# Patient Record
Sex: Female | Born: 1935 | Race: White | Hispanic: No | Marital: Married | Smoking: Never smoker
Health system: Southern US, Community
[De-identification: ages and names within clinical notes are randomized; demographics above are authoritative.]

## PROBLEM LIST (undated history)

## (undated) DIAGNOSIS — G20 Parkinson's disease: Secondary | ICD-10-CM

## (undated) HISTORY — PX: ENTERECTOMY: SHX306

## (undated) HISTORY — PX: EYE SURGERY: SHX253

## (undated) HISTORY — PX: ABDOMINAL HYSTERECTOMY: SHX81

---

## 2009-02-26 ENCOUNTER — Encounter: Admission: RE | Admit: 2009-02-26 | Discharge: 2009-05-27 | Payer: Self-pay | Admitting: Internal Medicine

## 2009-10-13 ENCOUNTER — Encounter: Admission: RE | Admit: 2009-10-13 | Discharge: 2010-01-11 | Payer: Self-pay | Admitting: Neurology

## 2015-09-23 ENCOUNTER — Ambulatory Visit: Payer: Medicare Other | Admitting: Physical Therapy

## 2015-09-26 ENCOUNTER — Encounter: Payer: Medicare Other | Admitting: Physical Therapy

## 2015-09-29 ENCOUNTER — Encounter: Payer: Self-pay | Admitting: Physical Therapy

## 2015-09-29 ENCOUNTER — Ambulatory Visit: Payer: Medicare Other | Attending: Neurology | Admitting: Physical Therapy

## 2015-09-29 DIAGNOSIS — R5381 Other malaise: Secondary | ICD-10-CM | POA: Diagnosis present

## 2015-09-29 DIAGNOSIS — R262 Difficulty in walking, not elsewhere classified: Secondary | ICD-10-CM | POA: Diagnosis present

## 2015-09-29 DIAGNOSIS — G20 Parkinson's disease: Secondary | ICD-10-CM | POA: Diagnosis present

## 2015-09-29 NOTE — Therapy (Signed)
[facility] [place] [address] [place], [place], [address] Phone: [phone]   Fax:  [phone]  Physical Therapy Evaluation  Patient Details  Name: Tiffany York MRN: [id number] Date of Birth: 25-Aug-1931 Referring Provider: Rubin Payor  Encounter Date: 09/29/2015      PT End of Session - 09/29/15 1458    Visit Number 1   Date for PT Re-Evaluation 11/27/15   PT Start Time 1405   PT Stop Time 1501   PT Time Calculation (min) 56 min   Equipment Utilized During Treatment Gait belt   Activity Tolerance Patient limited by fatigue   Behavior During Therapy Flat affect      History reviewed. No pertinent past medical history.  History reviewed. No pertinent past surgical history.  There were no vitals filed for this visit.  Visit Diagnosis:  Debility - Plan: PT plan of care cert/re-cert  Parkinson's disease (HCC) - Plan: PT plan of care cert/re-cert  Difficulty walking - Plan: PT plan of care cert/re-cert      Subjective Assessment - 09/29/15 1402    Subjective Patient has a diagnosis of atypical Parkinson's.  She has been on a decreasing activity pattern for the past few years.  She was exercising on own but over the past few years she has not been able to do this.  She had recent injections of Botox in the eyelids, left arm and left ankle.  She reports some difficulty with use of the left hand, difficulty grasping things.     Patient is accompained by: Family member   Limitations Lifting;Standing;Walking;House hold activities   Patient Stated Goals have better motions, be able to transfer better.   Currently in Pain? No/denies            [facility] PT Assessment - 09/29/15 0001    Assessment   Medical Diagnosis debility iwth atypical parkinson's   Referring Provider Siddiqui   Onset Date/Surgical Date 09/05/15   Hand Dominance Right   Prior Therapy 3 years ago   Balance Screen   Has the patient fallen in the past  6 months No   Has the patient had a decrease in activity level because of a fear of falling?  Yes   Is the patient reluctant to leave their home because of a fear of falling?  Yes   Home Environment   Additional Comments lives with sister.     Prior Function   Level of Independence Independent with transfers   Leisure PLOF:  about 2-3 years ago, [place], was able to transfer with set up only, she was able to walk with a walker x 50 feet with supervision only   Posture/Postural Control   Posture Comments fwd head, rounded shoulders, leans to the left   AROM   Overall AROM Comments the left hand is held with finger flexion with the 4th and 5th fingers held in 90 degrees flexion, I can passively extend to almost neutral but this causes pain to a 4/10, cannot oppose the 4th and 5th digits iwth thumb, AROM of the wrist is WFL's as is the elbow, AROM of the shoulder was limited to 88 degrees flexion, right shoulder flexion to 120 degrees, the left ankle was held in 35 degrees PF, she is unable to actively DF, Passively I can only get her to 20 degrees from neutral , the left ankle is also held into inversion of about 27 degrees, passively I can get her to within 5 degrees  of neutral, knees have ROM that is WFL's, right ankle AROM of DF was to 20 degrees from neutral and PROM of the right nakle was to 10 degrees from neutral, cannot invert the right ankle   Strength   Overall Strength Comments strength of the UE's grossly 4-/5, hips 3+/5, knees 4-/5   Flexibility   Soft Tissue Assessment /Muscle Length --  very tight calves   Palpation   Palpation comment pretty significant edema of the LE's   Transfers   Comments Needs assist with the left foot to assure that it does not roll over ont the lateral ankle. then needs mod assist to stand with gait belt, needed mod assist to stand and tolerance was 20 seconds of standing   Ambulation/Gait   Gait Comments unable to walk, due to the spasticity of the left  ankle into PF and inversion.  She was walking about 50-60 feet about 2 years ago.                   [facility] Adult PT Treatment/Exercise - 09/29/15 0001    Exercises   Exercises Lumbar   Lumbar Exercises: Seated   Other Seated Lumbar Exercises weighted ball over head reach 2x5, weighted ball obliques x 10 and weighted ball throws x 5 each side                  PT Short Term Goals - 09/29/15 1512    PT SHORT TERM GOAL #1   Title independent with her sister and doing sit and get fit exercises at least 1-2 x / week   Time 2   Period Weeks   Status New           PT Long Term Goals - 09/29/15 1512    PT LONG TERM GOAL #1   Title transfer with set up and CGA   Time 8   Period Weeks   Status New   PT LONG TERM GOAL #2   Title increase PROM of the left ankle to 0 degrees   Time 8   Period Weeks   Status New   PT LONG TERM GOAL #3   Title tolerate 5 minutes of standing   Time 8   Period Weeks   Status New   PT LONG TERM GOAL #4   Title walk with walker and min A 50 feet   Time 8   Period Weeks   Status New               Plan - 09/29/15 1504    Clinical Impression Statement Patient with diagnosis of atypical Parkinson's, she hhas really regressed in her abilities over the past 1-2 years, she had been coming to this clinic to work out on her own previously, at that time she was able to transfer with set up, able to walk with supervision using walker.  She currently needs mod A of 2 people to stand, one to hold the left foot in proper position and the other to help her stand.  She was unable to attempt walking   Pt will benefit from skilled therapeutic intervention in order to improve on the following deficits Abnormal gait;Cardiopulmonary status limiting activity;Decreased activity tolerance;Decreased balance;Decreased mobility;Decreased endurance;Decreased range of motion;Decreased strength;Difficulty walking;Increased muscle spasms;Impaired  flexibility;Postural dysfunction;Improper body mechanics   Rehab Potential Fair   PT Frequency 2x / week   PT Duration 8 weeks   PT Treatment/Interventions Electrical Stimulation;Moist Heat;Therapeutic exercise;Therapeutic activities;Functional mobility training;Gait training;Ultrasound;Balance training;Neuromuscular  re-education;Patient/family education;Manual techniques;Passive range of motion   PT Next Visit Plan slowly add exercises for general fitness, PROM for the wrist, fingers and ankle and gait.   Consulted and Agree with Plan of Care Patient          G-Codes - 10/24/15 1527    Functional Assessment Tool Used Foto 68% limitation   Functional Limitation Self care   Self Care Current Status (Z6109) At least 60 percent but less than 80 percent impaired, limited or restricted   Self Care Goal Status (U0454) At least 40 percent but less than 60 percent impaired, limited or restricted       Problem List There are no active problems to display for this patient.   Jearld Lesch., PT 2015/10/24, 3:37 PM  [facility] [place] [facility] [address] [place], [place], [address] Phone: [phone]   Fax:  [phone]  Name: Tiffany York MRN: [id number] Date of Birth: Feb 06, 1931

## 2015-10-02 ENCOUNTER — Encounter: Payer: Self-pay | Admitting: Physical Therapy

## 2015-10-02 ENCOUNTER — Ambulatory Visit: Payer: Medicare Other | Admitting: Physical Therapy

## 2015-10-02 DIAGNOSIS — R5381 Other malaise: Secondary | ICD-10-CM | POA: Diagnosis not present

## 2015-10-02 DIAGNOSIS — R262 Difficulty in walking, not elsewhere classified: Secondary | ICD-10-CM

## 2015-10-02 NOTE — Therapy (Signed)
[facility] [place] [facility] [address] [place], [place], [address] Phone: [phone]   Fax:  [phone]  Physical Therapy Treatment  Patient Details  Name: Tiffany York MRN: [id number] Date of Birth: 10-02-1930 Referring Provider: Rubin Payor  Encounter Date: 10/02/2015      PT End of Session - 10/02/15 1628    Visit Number 2   Date for PT Re-Evaluation 11/27/15   PT Start Time 1530   PT Stop Time 1620   PT Time Calculation (min) 50 min      History reviewed. No pertinent past medical history.  History reviewed. No pertinent past surgical history.  There were no vitals filed for this visit.  Visit Diagnosis:  Debility  Difficulty walking      Subjective Assessment - 10/02/15 1624    Subjective pt verb using a standing hoyer for transfers   Currently in Pain? No/denies                         [facility] Adult PT Treatment/Exercise - 10/02/15 0001    Exercises   Exercises Knee/Hip   Lumbar Exercises: Aerobic   Stationary Bike --   Lumbar Exercises: Seated   Other Seated Lumbar Exercises wt ball reach and oblique   Knee/Hip Exercises: Aerobic   Nustep L 3 5 min   Knee/Hip Exercises: Standing   Other Standing Knee Exercises static standing working on weight shift, righting reactions and picking feet up  mod A to stab and Left LE and assist in balance   Knee/Hip Exercises: Seated   Long Arc Quad Strengthening;Both;2 sets;10 reps  2# plus marching and hip abd                PT Education - 10/02/15 1628    Education provided Yes   Education Details standing transfer frame 10 min 2 times per day,educated sister to make sure Left ankle is straight   Person(s) Educated Patient;Caregiver(s)   Methods Explanation;Demonstration   Comprehension Verbalized understanding          PT Short Term Goals - 09/29/15 1512    PT SHORT TERM GOAL #1   Title independent with her sister and doing sit and get  fit exercises at least 1-2 x / week   Time 2   Period Weeks   Status New           PT Long Term Goals - 09/29/15 1512    PT LONG TERM GOAL #1   Title transfer with set up and CGA   Time 8   Period Weeks   Status New   PT LONG TERM GOAL #2   Title increase PROM of the left ankle to 0 degrees   Time 8   Period Weeks   Status New   PT LONG TERM GOAL #3   Title tolerate 5 minutes of standing   Time 8   Period Weeks   Status New   PT LONG TERM GOAL #4   Title walk with walker and min A 50 feet   Time 8   Period Weeks   Status New               Plan - 10/02/15 1629    Clinical Impression Statement pt with increased tolerance to standing, able to stand 4 times 1-2 mins each time working on weight shift and picking up feet. Assistance required to stab Left ankle and aid in balance and  fwd weight shift. Tolerated ther ex well. Very tight LE.   PT Next Visit Plan assess standing tolerance at home and progress here        Problem List There are no active problems to display for this patient.   Juancarlos Crescenzo,ANGIE PTA 10/02/2015, 4:33 PM  [facility] [place] [facility] [address] [place], [place], [address] Phone: [phone]   Fax:  [phone]  Name: Tiffany York MRN: [id number] Date of Birth: Feb 09, 1931

## 2015-10-07 ENCOUNTER — Encounter: Payer: Self-pay | Admitting: Physical Therapy

## 2015-10-07 ENCOUNTER — Ambulatory Visit: Payer: Medicare Other | Admitting: Physical Therapy

## 2015-10-07 DIAGNOSIS — R5381 Other malaise: Secondary | ICD-10-CM

## 2015-10-07 DIAGNOSIS — R262 Difficulty in walking, not elsewhere classified: Secondary | ICD-10-CM

## 2015-10-07 DIAGNOSIS — G20 Parkinson's disease: Secondary | ICD-10-CM

## 2015-10-07 NOTE — Therapy (Signed)
[facility] [place] [facility] [address] [place], [place], [address] Phone: [phone]   Fax:  [phone]  Physical Therapy Treatment  Patient Details  Name: Tiffany York MRN: [id number] Date of Birth: 01-Feb-1931 Referring Provider: Rubin Payor  Encounter Date: 10/07/2015      PT End of Session - 10/07/15 1443    Visit Number 3   Date for PT Re-Evaluation 11/27/15   PT Start Time 1356   PT Stop Time 1445   PT Time Calculation (min) 49 min      History reviewed. No pertinent past medical history.  History reviewed. No pertinent past surgical history.  There were no vitals filed for this visit.  Visit Diagnosis:  Debility  Difficulty walking  Parkinson's disease (HCC)      Subjective Assessment - 10/07/15 1402    Subjective could not use transfer aid to stadn with as it was too painful and pinched in armpits   Currently in Pain? No/denies                         [facility] Adult PT Treatment/Exercise - 10/07/15 0001    Ambulation/Gait   Gait Comments amb HHA off 2 with fascilitation of wt shift and Left anklle stab 18 feet   Lumbar Exercises: Seated   Other Seated Lumbar Exercises wt ball reach and oblique   Knee/Hip Exercises: Aerobic   Nustep L 3 6 min   Knee/Hip Exercises: Machines for Strengthening   Other Machine seated red tband LAQ,HS curl ,hip abd,marching  yellow tabdn scap stab 10 reps each   Knee/Hip Exercises: Standing   Hip ADduction Strengthening;Right;1 set;10 reps  mod A to fasciltate weight thru Left LE. marching   Other Standing Knee Exercises static standing with RW 2 min 2 times   Other Standing Knee Exercises dynamic stadning with func reaching and LE mvmt                  PT Short Term Goals - 09/29/15 1512    PT SHORT TERM GOAL #1   Title independent with her sister and doing sit and get fit exercises at least 1-2 x / week   Time 2   Period Weeks   Status New            PT Long Term Goals - 09/29/15 1512    PT LONG TERM GOAL #1   Title transfer with set up and CGA   Time 8   Period Weeks   Status New   PT LONG TERM GOAL #2   Title increase PROM of the left ankle to 0 degrees   Time 8   Period Weeks   Status New   PT LONG TERM GOAL #3   Title tolerate 5 minutes of standing   Time 8   Period Weeks   Status New   PT LONG TERM GOAL #4   Title walk with walker and min A 50 feet   Time 8   Period Weeks   Status New               Plan - 10/07/15 1443    Clinical Impression Statement pt with increased righting reaction, increased static standing and initiation of gait   PT Next Visit Plan progress activity standing and gait. LE strength        Problem List There are no active problems to display for this patient.   PAYSEUR,ANGIE PTA  10/07/2015, 2:46 PM  [facility] [place] [facility] [address] [place], [place], [address] Phone: [phone]   Fax:  [phone]  Name: Tiffany York MRN: [id number] Date of Birth: 1930/10/16

## 2015-10-09 ENCOUNTER — Ambulatory Visit: Payer: Medicare Other | Admitting: Physical Therapy

## 2015-10-09 ENCOUNTER — Encounter: Payer: Self-pay | Admitting: Physical Therapy

## 2015-10-09 DIAGNOSIS — R5381 Other malaise: Secondary | ICD-10-CM | POA: Diagnosis not present

## 2015-10-09 DIAGNOSIS — R262 Difficulty in walking, not elsewhere classified: Secondary | ICD-10-CM

## 2015-10-09 DIAGNOSIS — G20 Parkinson's disease: Secondary | ICD-10-CM

## 2015-10-09 NOTE — Therapy (Signed)
[facility] [place] [facility] [address] [place], [place], [address] Phone: [phone]   Fax:  [phone]  Physical Therapy Treatment  Patient Details  Name: Tiffany York MRN: [id number] Date of Birth: 1931-01-25 Referring Provider: Rubin Payor  Encounter Date: 10/09/2015      PT End of Session - 10/09/15 1533    Visit Number 4   Date for PT Re-Evaluation 11/27/15   PT Start Time 1446   PT Stop Time 1530   PT Time Calculation (min) 44 min   Equipment Utilized During Treatment Gait belt   Activity Tolerance Patient limited by fatigue   Behavior During Therapy Flat affect      History reviewed. No pertinent past medical history.  History reviewed. No pertinent past surgical history.  There were no vitals filed for this visit.  Visit Diagnosis:  Debility  Difficulty walking  Parkinson's disease [facility])      Subjective Assessment - 10/09/15 1454    Subjective Felt okay after last visit, not sore   Currently in Pain? No/denies                         [facility] Adult PT Treatment/Exercise - 10/09/15 0001    Ambulation/Gait   Gait Comments ambulation with FWW (her own) with one person assist to block the left lateral ankle roll, some assist to advance walker, verbal cues to weight shift and take steps, and a lot of assist due to backward lean   Lumbar Exercises: Seated   Other Seated Lumbar Exercises wt ball reach and oblique   Knee/Hip Exercises: Aerobic   Nustep L 4 6 min   Knee/Hip Exercises: Standing   Other Standing Knee Exercises static standing with RW 2 min 2 times   Other Standing Knee Exercises dynamic stadning with func reaching and LE mvmt   Knee/Hip Exercises: Seated   Long Arc Quad Strengthening;Both;2 sets;10 reps   Marching Limitations 2# 2x10 bilaterally   Abduction/Adduction  20 reps   Abd/Adduction Limitations isometrics                  PT Short Term Goals - 10/09/15 1535    PT SHORT TERM GOAL #1   Title independent with her sister and doing sit and get fit exercises at least 1-2 x / week   Status On-going           PT Long Term Goals - 09/29/15 1512    PT LONG TERM GOAL #1   Title transfer with set up and CGA   Time 8   Period Weeks   Status New   PT LONG TERM GOAL #2   Title increase PROM of the left ankle to 0 degrees   Time 8   Period Weeks   Status New   PT LONG TERM GOAL #3   Title tolerate 5 minutes of standing   Time 8   Period Weeks   Status New   PT LONG TERM GOAL #4   Title walk with walker and min A 50 feet   Time 8   Period Weeks   Status New               Plan - 10/09/15 1534    Clinical Impression Statement Patient does suprisingly well for her condition, she was able to walk with me today 2x12 feet, with a lot of assist and cues but she has not walked at all in about  9 months.  The left ankel needs gauding with any standing due to lateral roll   PT Next Visit Plan progress activity standing and gait. LE strength   Consulted and Agree with Plan of Care Patient        Problem List There are no active problems to display for this patient.   Jearld Lesch., PT 10/09/2015, 3:37 PM  [facility] [place] [facility] [address] [place], [place], [address] Phone: [phone]   Fax:  [phone]  Name: Tiffany York MRN: [id number] Date of Birth: 31-Dec-1930

## 2015-10-14 ENCOUNTER — Ambulatory Visit: Payer: Medicare Other | Admitting: Physical Therapy

## 2015-10-14 ENCOUNTER — Encounter: Payer: Self-pay | Admitting: Physical Therapy

## 2015-10-14 DIAGNOSIS — R5381 Other malaise: Secondary | ICD-10-CM | POA: Diagnosis not present

## 2015-10-14 DIAGNOSIS — R262 Difficulty in walking, not elsewhere classified: Secondary | ICD-10-CM

## 2015-10-14 NOTE — Therapy (Signed)
[facility] [place] [facility] [address] [place], [place], [address] Phone: [phone]   Fax:  [phone]  Physical Therapy Treatment  Patient Details  Name: Tiffany York MRN: [id number] Date of Birth: [birth year] Referring Provider: Rubin Payor  Encounter Date: 10/14/2015      PT End of Session - 10/14/15 1444    Visit Number 5   Date for PT Re-Evaluation 11/27/15   PT Start Time 1400   PT Stop Time 1450   PT Time Calculation (min) 50 min      History reviewed. No pertinent past medical history.  History reviewed. No pertinent past surgical history.  There were no vitals filed for this visit.  Visit Diagnosis:  Difficulty walking  Debility      Subjective Assessment - 10/14/15 1409    Subjective doing okay                         [facility] Adult PT Treatment/Exercise - 10/14/15 0001    Ambulation/Gait   Gait Comments ambulation with FWW (her own), 20 feet,20 feet 40 feet, with one person assist to block the left lateral ankle roll, some assist to advance walker, verbal cues to weight shift and take steps, and a lot of assist due to backward lean  left foot IR,and left foot crosses midline with stepping   High Level Balance   High Level Balance Activities --  static standing,dynamic standing,func reaching,ball toss   Knee/Hip Exercises: Aerobic   Nustep L 4 6 min   Knee/Hip Exercises: Machines for Strengthening   Cybex Knee Extension 10# 2 sets 10   Cybex Knee Flexion 15# 2 sets 10                PT Education - 10/14/15 1444    Education provided Yes   Education Details LAQ and marching 10 reps each leg 3-5 times per day   Person(s) Educated Patient   Methods Explanation;Demonstration   Comprehension Verbalized understanding;Returned demonstration          PT Short Term Goals - 10/14/15 1446    PT SHORT TERM GOAL #1   Title independent with her sister and doing sit and get fit exercises  at least 1-2 x / week   Status On-going           PT Long Term Goals - 10/14/15 1446    PT LONG TERM GOAL #1   Title transfer with set up and CGA   Status On-going   PT LONG TERM GOAL #2   Title increase PROM of the left ankle to 0 degrees   Status On-going   PT LONG TERM GOAL #3   Title tolerate 5 minutes of standing   Status On-going   PT LONG TERM GOAL #4   Title walk with walker and min A 50 feet   Status On-going               Plan - 10/14/15 1445    Clinical Impression Statement pt with increased ability to amb today,as fatigued Left ankle rolls more and needs increase support. posterior lean with some righting reaction,occassional help to advance walker. Progressing with weight bearinga nd stadning activities.   PT Next Visit Plan progress activity standing and gait. LE strength        Problem List There are no active problems to display for this patient.   Marcha Licklider,ANGIE PTA 10/14/2015, 2:49 PM  [facility] [place] [address] [place], [place], [address] Phone: [phone]   Fax:  [phone]  Name: Tiffany York MRN: [id number] Date of Birth: Dec 11, 1930

## 2015-10-16 ENCOUNTER — Ambulatory Visit: Payer: Medicare Other | Attending: Neurology | Admitting: Physical Therapy

## 2015-10-16 ENCOUNTER — Encounter: Payer: Self-pay | Admitting: Physical Therapy

## 2015-10-16 DIAGNOSIS — R5381 Other malaise: Secondary | ICD-10-CM | POA: Insufficient documentation

## 2015-10-16 DIAGNOSIS — G20 Parkinson's disease: Secondary | ICD-10-CM | POA: Insufficient documentation

## 2015-10-16 DIAGNOSIS — R262 Difficulty in walking, not elsewhere classified: Secondary | ICD-10-CM | POA: Insufficient documentation

## 2015-10-16 NOTE — Therapy (Signed)
[facility] [place] [facility] [address] [place], [place], [address] Phone: [phone]   Fax:  [phone]  Physical Therapy Treatment  Patient Details  Name: Tiffany York MRN: [id number] Date of Birth: 04-24-1931 Referring Provider: Rubin Payor  Encounter Date: 10/16/2015      PT End of Session - 10/16/15 1446    Visit Number 6   Date for PT Re-Evaluation 11/27/15   PT Start Time 1401   PT Stop Time 1446   PT Time Calculation (min) 45 min   Equipment Utilized During Treatment Gait belt   Activity Tolerance Patient limited by fatigue   Behavior During Therapy Flat affect      History reviewed. No pertinent past medical history.  History reviewed. No pertinent past surgical history.  There were no vitals filed for this visit.  Visit Diagnosis:  Difficulty walking  Debility  Parkinson's disease (HCC)      Subjective Assessment - 10/16/15 1404    Subjective I was tired and sore after the last visit.   Currently in Pain? No/denies                         [facility] Adult PT Treatment/Exercise - 10/16/15 0001    Ambulation/Gait   Gait Comments ambulation with FWW (her own), 20 feet,20 feet 40 feet, with one person assist to block the left lateral ankle roll, some assist to advance walker, verbal cues to weight shift and take steps, and a lot of assist due to backward lean   High Level Balance   High Level Balance Comments standing balance activities, weight shift, reaching   Lumbar Exercises: Aerobic   Elliptical NuStep Level 5x6 minutes   UBE (Upper Arm Bike) Level 1 x 4 minutes   Lumbar Exercises: Machines for Strengthening   Other Lumbar Machine Exercise 5# on pulley system row and chest press type activities, 15# straight arm pull downs all 2x10   Lumbar Exercises: Seated   Other Seated Lumbar Exercises wt ball reach and oblique, seated [facility] parkinson's moves 15 reps each   Knee/Hip Exercises: Standing   Other Standing Knee Exercises static standing with RW 2 min 2 times   Knee/Hip Exercises: Seated   Long Arc Quad 2 sets;10 reps   Long Arc Quad Weight 3 lbs.   Marching Limitations 2# 2x10 bilaterally                  PT Short Term Goals - 10/16/15 1451    PT SHORT TERM GOAL #1   Title independent with her sister and doing sit and get fit exercises at least 1-2 x / week   Status Achieved           PT Long Term Goals - 10/14/15 1446    PT LONG TERM GOAL #1   Title transfer with set up and CGA   Status On-going   PT LONG TERM GOAL #2   Title increase PROM of the left ankle to 0 degrees   Status On-going   PT LONG TERM GOAL #3   Title tolerate 5 minutes of standing   Status On-going   PT LONG TERM GOAL #4   Title walk with walker and min A 50 feet   Status On-going               Plan - 10/16/15 1449    Clinical Impression Statement Patient remains with flat affect due to the Parkinson's, she  puts forth good effort with exercises but with balance she is very fearful and tends to lean toward the back, requiring encouragement to lean forward.   PT Next Visit Plan progress activity standing and gait. LE strength   Consulted and Agree with Plan of Care Patient        Problem List There are no active problems to display for this patient.   Jearld Lesch., PT 10/16/2015, 2:52 PM  [facility] [place] [facility] [address] [place], [place], [address] Phone: [phone]   Fax:  [phone]  Name: Tiffany York MRN: [id number] Date of Birth: [birth year]

## 2015-10-21 ENCOUNTER — Encounter: Payer: Self-pay | Admitting: Physical Therapy

## 2015-10-21 ENCOUNTER — Ambulatory Visit: Payer: Medicare Other | Admitting: Physical Therapy

## 2015-10-21 DIAGNOSIS — R5381 Other malaise: Secondary | ICD-10-CM

## 2015-10-21 DIAGNOSIS — G20 Parkinson's disease: Secondary | ICD-10-CM

## 2015-10-21 DIAGNOSIS — R262 Difficulty in walking, not elsewhere classified: Secondary | ICD-10-CM | POA: Diagnosis not present

## 2015-10-21 NOTE — Therapy (Signed)
[facility] [place] [address] [place], [place], [address] Phone: [phone]   Fax:  [phone]  Physical Therapy Treatment  Patient Details  Name: Tiffany York MRN: [id number] Date of Birth: [birth year] Referring Provider: Rubin Payor  Encounter Date: 10/21/2015      PT End of Session - 10/21/15 1441    Visit Number 7   Date for PT Re-Evaluation 11/27/15   PT Start Time 1345   PT Stop Time 1430   PT Time Calculation (min) 45 min   Activity Tolerance Patient limited by fatigue   Behavior During Therapy Flat affect      History reviewed. No pertinent past medical history.  History reviewed. No pertinent past surgical history.  There were no vitals filed for this visit.  Visit Diagnosis:  Difficulty walking  Debility  Parkinson's disease (HCC)      Subjective Assessment - 10/21/15 1357    Subjective "Im ok"   Currently in Pain? No/denies   Multiple Pain Sites No                         [facility] Adult PT Treatment/Exercise - 10/21/15 0001    Ambulation/Gait   Gait Comments ambulation with FWW (her own), 20 feet, with two person assist to block the left lateral ankle roll, some assist to advance walker, verbal cues to weight shift and take steps, and a lot of assist due to backward lean   Lumbar Exercises: Aerobic   Elliptical NuStep Level 3 x6 minutes   UBE (Upper Arm Bike) Level 1 x 4 minutes   Lumbar Exercises: Machines for Strengthening   Other Lumbar Machine Exercise 5# on pulley system row and chest press type activities, 15# straight arm pull downs all 2x10   Lumbar Exercises: Seated   Other Seated Lumbar Exercises Seated forward reach alternating UE and bilat UE,  OHP red ball 2x10, Rows with Red Tband 2x10,    Knee/Hip Exercises: Seated   Long Arc Quad 2 sets;10 reps                  PT Short Term Goals - 10/16/15 1451    PT SHORT TERM GOAL #1   Title independent with her  sister and doing sit and get fit exercises at least 1-2 x / week   Status Achieved           PT Long Term Goals - 10/14/15 1446    PT LONG TERM GOAL #1   Title transfer with set up and CGA   Status On-going   PT LONG TERM GOAL #2   Title increase PROM of the left ankle to 0 degrees   Status On-going   PT LONG TERM GOAL #3   Title tolerate 5 minutes of standing   Status On-going   PT LONG TERM GOAL #4   Title walk with walker and min A 50 feet   Status On-going               Plan - 10/21/15 1442    Clinical Impression Statement Pt c/o of RLE pain with ambulation, flat affect due to parkinson's'. Fearful with ambulation requires cues to correct backward lean.    Pt will benefit from skilled therapeutic intervention in order to improve on the following deficits Abnormal gait;Cardiopulmonary status limiting activity;Decreased activity tolerance;Decreased balance;Decreased mobility;Decreased endurance;Decreased range of motion;Decreased strength;Difficulty walking;Increased muscle spasms;Impaired flexibility;Postural dysfunction;Improper body mechanics   Rehab Potential  Fair   PT Frequency 2x / week   PT Duration 8 weeks   PT Treatment/Interventions Electrical Stimulation;Moist Heat;Therapeutic exercise;Therapeutic activities;Functional mobility training;Gait training;Ultrasound;Balance training;Neuromuscular re-education;Patient/family education;Manual techniques;Passive range of motion   PT Next Visit Plan progress activity standing and gait. LE strength        Problem List There are no active problems to display for this patient.   Grayce Sessions, PTA  10/21/2015, 2:45 PM  [facility] [place] [address] [place], [place], [address] Phone: [phone]   Fax:  [phone]  Name: Tiffany York MRN: [id number] Date of Birth: 1931-08-09

## 2015-10-28 ENCOUNTER — Encounter: Payer: Self-pay | Admitting: Physical Therapy

## 2015-10-28 ENCOUNTER — Ambulatory Visit: Payer: Medicare Other | Admitting: Physical Therapy

## 2015-10-28 DIAGNOSIS — R262 Difficulty in walking, not elsewhere classified: Secondary | ICD-10-CM | POA: Diagnosis not present

## 2015-10-28 DIAGNOSIS — R5381 Other malaise: Secondary | ICD-10-CM

## 2015-10-28 DIAGNOSIS — G20 Parkinson's disease: Secondary | ICD-10-CM

## 2015-10-28 NOTE — Therapy (Signed)
[facility] [place] [address] [place], [place], [address] Phone: [phone]   Fax:  [phone]  Physical Therapy Treatment  Patient Details  Name: Tiffany York MRN: [id number] Date of Birth: 17-Apr-1931 Referring Provider: Rubin Payor  Encounter Date: 10/28/2015      PT End of Session - 10/28/15 1428    Visit Number 8   Date for PT Re-Evaluation 11/27/15   PT Start Time 1346   PT Stop Time 1428   PT Time Calculation (min) 42 min   Activity Tolerance Patient limited by fatigue   Behavior During Therapy Flat affect      History reviewed. No pertinent past medical history.  History reviewed. No pertinent past surgical history.  There were no vitals filed for this visit.  Visit Diagnosis:  Difficulty walking  Debility  Parkinson's disease [facility])      Subjective Assessment - 10/28/15 1352    Subjective "Im doing fine"   Currently in Pain? No/denies   Multiple Pain Sites No                         [facility] Adult PT Treatment/Exercise - 10/28/15 0001    Ambulation/Gait   Gait Comments ambulation with FWW (her own), 22 feet, with one person assist to block the left lateral ankle roll, some assist to advance walker, verbal cues to weight shift and take steps, and a lot of assist due to backward lean   Lumbar Exercises: Aerobic   Elliptical NuStep Level 3 x6 minutes   UBE (Upper Arm Bike) Level 1 x 4 minutes   Lumbar Exercises: Machines for Strengthening   Other Lumbar Machine Exercise 5# on pulley system row and chest press type activities, straight arm pull downs all 2x15   Knee/Hip Exercises: Seated   Long Arc Quad 2 sets;10 reps   Long Arc Quad Weight 2 lbs.   Marching Limitations 2# 2x10 bilaterally                  PT Short Term Goals - 10/16/15 1451    PT SHORT TERM GOAL #1   Title independent with her sister and doing sit and get fit exercises at least 1-2 x / week   Status  Achieved           PT Long Term Goals - 10/14/15 1446    PT LONG TERM GOAL #1   Title transfer with set up and CGA   Status On-going   PT LONG TERM GOAL #2   Title increase PROM of the left ankle to 0 degrees   Status On-going   PT LONG TERM GOAL #3   Title tolerate 5 minutes of standing   Status On-going   PT LONG TERM GOAL #4   Title walk with walker and min A 50 feet   Status On-going               Plan - 10/28/15 1428    Clinical Impression Statement Continues to require cues with amb not to lean backwards. Pt requires verbal and tactile cues to perform ambulation. Flat affect remains due to parkinson's. No c/o pain during today's treatment.   Pt will benefit from skilled therapeutic intervention in order to improve on the following deficits Abnormal gait;Cardiopulmonary status limiting activity;Decreased activity tolerance;Decreased balance;Decreased mobility;Decreased endurance;Decreased range of motion;Decreased strength;Difficulty walking;Increased muscle spasms;Impaired flexibility;Postural dysfunction;Improper body mechanics   Rehab Potential Fair   PT Frequency 2x /  week   PT Duration 8 weeks   PT Treatment/Interventions Electrical Stimulation;Moist Heat;Therapeutic exercise;Therapeutic activities;Functional mobility training;Gait training;Ultrasound;Balance training;Neuromuscular re-education;Patient/family education;Manual techniques;Passive range of motion   PT Next Visit Plan progress activity standing and gait. LE strength        Problem List There are no active problems to display for this patient.   Grayce Sessions, PTA  10/28/2015, 2:31 PM  [facility] [place] [address] [place], [place], [address] Phone: [phone]   Fax:  [phone]  Name: Tiffany York MRN: [id number] Date of Birth: June 19, 1931

## 2015-10-30 ENCOUNTER — Ambulatory Visit: Payer: Medicare Other | Admitting: Physical Therapy

## 2015-10-30 ENCOUNTER — Encounter: Payer: Self-pay | Admitting: Physical Therapy

## 2015-10-30 DIAGNOSIS — R5381 Other malaise: Secondary | ICD-10-CM

## 2015-10-30 DIAGNOSIS — R262 Difficulty in walking, not elsewhere classified: Secondary | ICD-10-CM | POA: Diagnosis not present

## 2015-10-30 NOTE — Therapy (Signed)
[facility] [place] [address] [place], [place], [address] Phone: [phone]   Fax:  [phone]  Physical Therapy Treatment  Patient Details  Name: Tiffany York MRN: [id number] Date of Birth: [birth year] Referring Provider: Rubin Payor  Encounter Date: 10/30/2015      PT End of Session - 10/30/15 1524    Visit Number 9   PT Start Time 1432   PT Stop Time 1522   PT Time Calculation (min) 50 min   Activity Tolerance Patient limited by fatigue;Patient limited by pain   Behavior During Therapy Flat affect      History reviewed. No pertinent past medical history.  History reviewed. No pertinent past surgical history.  There were no vitals filed for this visit.  Visit Diagnosis:  Difficulty walking  Debility      Subjective Assessment - 10/30/15 1435    Subjective Pt repots she is feeling all right   Currently in Pain? No/denies   Multiple Pain Sites No                         [facility] Adult PT Treatment/Exercise - 10/30/15 0001    Lumbar Exercises: Aerobic   Elliptical NuStep Level 3 x6 minutes   UBE (Upper Arm Bike) Level 1 x 4 minutes   Lumbar Exercises: Seated   Other Seated Lumbar Exercises Seated forward reach alternating UE and bilat UE,  OHP red ball 2x10, Rows with Red Tband 2x10; seated ball toss     Knee/Hip Exercises: Standing   Other Standing Knee Exercises Static standing (UE on therapist shoulder), march 2x10    Knee/Hip Exercises: Seated   Long Arc Quad 2 sets;10 reps   Long Arc Quad Weight 3 lbs.   Ball Squeeze 2x10   Marching Limitations 3# 2x10 bilaterally                  PT Short Term Goals - 10/16/15 1451    PT SHORT TERM GOAL #1   Title independent with her sister and doing sit and get fit exercises at least 1-2 x / week   Status Achieved           PT Long Term Goals - 10/14/15 1446    PT LONG TERM GOAL #1   Title transfer with set up and CGA   Status  On-going   PT LONG TERM GOAL #2   Title increase PROM of the left ankle to 0 degrees   Status On-going   PT LONG TERM GOAL #3   Title tolerate 5 minutes of standing   Status On-going   PT LONG TERM GOAL #4   Title walk with walker and min A 50 feet   Status On-going               Plan - 10/30/15 1525    Clinical Impression Statement No ambulation today due to Pt L ankle pain when walking. Pt able to tolerate standing with weight shifts with minor c/o pain with L ankle. tolerated all other interventions well.   Pt will benefit from skilled therapeutic intervention in order to improve on the following deficits Abnormal gait;Cardiopulmonary status limiting activity;Decreased activity tolerance;Decreased balance;Decreased mobility;Decreased endurance;Decreased range of motion;Decreased strength;Difficulty walking;Increased muscle spasms;Impaired flexibility;Postural dysfunction;Improper body mechanics   Rehab Potential Fair   PT Frequency 2x / week   PT Duration 8 weeks   PT Treatment/Interventions Electrical Stimulation;Moist Heat;Therapeutic exercise;Therapeutic activities;Functional mobility training;Gait training;Ultrasound;Balance training;Neuromuscular  re-education;Patient/family education;Manual techniques;Passive range of motion   PT Next Visit Plan progress activity standing and gait. LE strength        Problem List There are no active problems to display for this patient.   Grayce Sessions, PTA  10/30/2015, 3:29 PM  [facility] [place] [address] [place], [place], [address] Phone: [phone]   Fax:  [phone]  Name: Tiffany York MRN: [id number] Date of Birth: 09-06-1931

## 2015-11-03 ENCOUNTER — Ambulatory Visit: Payer: Medicare Other | Admitting: Physical Therapy

## 2015-11-05 ENCOUNTER — Encounter: Payer: Self-pay | Admitting: Physical Therapy

## 2015-11-05 ENCOUNTER — Ambulatory Visit: Payer: Medicare Other | Admitting: Physical Therapy

## 2015-11-05 DIAGNOSIS — R5381 Other malaise: Secondary | ICD-10-CM

## 2015-11-05 DIAGNOSIS — R262 Difficulty in walking, not elsewhere classified: Secondary | ICD-10-CM | POA: Diagnosis not present

## 2015-11-05 DIAGNOSIS — G20 Parkinson's disease: Secondary | ICD-10-CM

## 2015-11-05 NOTE — Therapy (Signed)
[facility] [place] [address] [place], [place], [address] Phone: [phone]   Fax:  [phone]  Physical Therapy Treatment  Patient Details  Name: Tiffany York MRN: [id number] Date of Birth: [birth year] Referring Provider: Rubin Payor  Encounter Date: 11/05/2015      PT End of Session - 11/05/15 1150    Visit Number 10   PT Start Time 1100   PT Stop Time 1141   PT Time Calculation (min) 41 min      History reviewed. No pertinent past medical history.  History reviewed. No pertinent past surgical history.  There were no vitals filed for this visit.  Visit Diagnosis:  Difficulty walking  Debility  Parkinson's disease [facility])      Subjective Assessment - 11/05/15 1105    Subjective "Im doing all right"   Patient is accompained by: Family member   Currently in Pain? No/denies   Multiple Pain Sites No                         [facility] Adult PT Treatment/Exercise - 11/05/15 0001    Lumbar Exercises: Aerobic   Elliptical NuStep Level 3 x6 minutes   UBE (Upper Arm Bike) Level 1 x 4 minutes   Lumbar Exercises: Machines for Strengthening   Other Lumbar Machine Exercise 5# on pulley system, hi to low rows, rows, and chest press type activities, straight arm pull downs all 2x15   Lumbar Exercises: Seated   Other Seated Lumbar Exercises OHP with red ball 2x10   Knee/Hip Exercises: Seated   Long Arc Quad 2 sets;15 reps   Long Arc Quad Weight 3 lbs.   Ball Squeeze 2x15   Marching Limitations 3# 2x10 bilaterally                  PT Short Term Goals - 10/16/15 1451    PT SHORT TERM GOAL #1   Title independent with her sister and doing sit and get fit exercises at least 1-2 x / week   Status Achieved           PT Long Term Goals - 10/14/15 1446    PT LONG TERM GOAL #1   Title transfer with set up and CGA   Status On-going   PT LONG TERM GOAL #2   Title increase PROM of the left ankle to  0 degrees   Status On-going   PT LONG TERM GOAL #3   Title tolerate 5 minutes of standing   Status On-going   PT LONG TERM GOAL #4   Title walk with walker and min A 50 feet   Status On-going               Plan - 11/05/15 1150    Clinical Impression Statement Again no ambulation due to pt c/o L ankle pain.Pt performed all exercises well.  Pt sister and caregiver present for today's treatment in preporation for pt to transfer to independent program. Pt's care give instructed on all interventions . Caregiver instructed on proper machine set up, and how pt is suppose to properly execute each exercise. Caregiver verbalized understanding.    Pt will benefit from skilled therapeutic intervention in order to improve on the following deficits Abnormal gait;Cardiopulmonary status limiting activity;Decreased activity tolerance;Decreased balance;Decreased mobility;Decreased endurance;Decreased range of motion;Decreased strength;Difficulty walking;Increased muscle spasms;Impaired flexibility;Postural dysfunction;Improper body mechanics   Rehab Potential Fair   PT Frequency 2x / week   PT  Duration 8 weeks   PT Treatment/Interventions Electrical Stimulation;Moist Heat;Therapeutic exercise;Therapeutic activities;Functional mobility training;Gait training;Ultrasound;Balance training;Neuromuscular re-education;Patient/family education;Manual techniques;Passive range of motion   PT Next Visit Plan progress activity standing and gait. LE strength, prepared for indp program.        Problem List There are no active problems to display for this patient.   Grayce Sessions, PTA  11/05/2015, 11:55 AM  [facility] [place] [address] [place], [place], [address] Phone: [phone]   Fax:  [phone]  Name: Tiffany York MRN: [id number] Date of Birth: [birth year]

## 2015-11-07 ENCOUNTER — Encounter: Payer: Self-pay | Admitting: Physical Therapy

## 2015-11-07 ENCOUNTER — Ambulatory Visit: Payer: Medicare Other | Admitting: Physical Therapy

## 2015-11-07 DIAGNOSIS — R262 Difficulty in walking, not elsewhere classified: Secondary | ICD-10-CM

## 2015-11-07 DIAGNOSIS — R5381 Other malaise: Secondary | ICD-10-CM

## 2015-11-07 NOTE — Therapy (Signed)
[facility] [place] [address] [place], [place], [address] Phone: [phone]   Fax:  [phone]  Physical Therapy Treatment  Patient Details  Name: Tiffany York MRN: [id number] Date of Birth: Aug 06, 1931 Referring Provider: Rubin Payor  Encounter Date: 11/07/2015      PT End of Session - 11/07/15 1101    Visit Number 11   Date for PT Re-Evaluation 11/27/15   PT Start Time 1015   PT Stop Time 1100   PT Time Calculation (min) 45 min      History reviewed. No pertinent past medical history.  History reviewed. No pertinent past surgical history.  There were no vitals filed for this visit.  Visit Diagnosis:  Difficulty walking  Debility      Subjective Assessment - 11/07/15 1025    Subjective doing the same, brought transport chair to see if works better on equipment   Patient is accompained by: Family member   Currently in Pain? No/denies                         [facility] Adult PT Treatment/Exercise - 11/07/15 0001    Lumbar Exercises: Aerobic   Elliptical nustep L 4 7 min   UBE (Upper Arm Bike) Level 1 x 6 minutes   Lumbar Exercises: Seated   Other Seated Lumbar Exercises 2# shld flex,abd,chest press and bicep curl 2 sets 10  5# pulleys   Knee/Hip Exercises: Seated   Other Seated Knee/Hip Exercises 3# LAQ,marching and hip abd 2 sets 10   Sit to Sand with UE support;2 sets  30 sec each at sink with caregiver                  PT Short Term Goals - 10/16/15 1451    PT SHORT TERM GOAL #1   Title independent with her sister and doing sit and get fit exercises at least 1-2 x / week   Status Achieved           PT Long Term Goals - 11/07/15 1103    PT LONG TERM GOAL #1   Title transfer with set up and CGA   PT LONG TERM GOAL #2   Title increase PROM of the left ankle to 0 degrees   Status On-going   PT LONG TERM GOAL #3   Title tolerate 5 minutes of standing   Status On-going   PT LONG TERM GOAL #4   Title walk with walker and min A 50 feet   Status On-going               Plan - 11/07/15 1101    Clinical Impression Statement education with CG to transition to idependant ex program   PT Next Visit Plan 1 visit next week then HOLD 2 weeks to assure transition to independant ex program        Problem List There are no active problems to display for this patient.   Katiria Calame,ANGIE PTA 11/07/2015, 11:04 AM  [facility] [place] [address] [facility] [address] [facility] [address] [place], [place], [address] Phone: [phone]   Fax:  [phone]  Name: Ezella Kell MRN: [id number] Date of Birth: [birth year]

## 2015-11-11 ENCOUNTER — Ambulatory Visit: Payer: Medicare Other | Admitting: Physical Therapy

## 2015-11-11 ENCOUNTER — Encounter: Payer: Self-pay | Admitting: Physical Therapy

## 2015-11-11 DIAGNOSIS — R262 Difficulty in walking, not elsewhere classified: Secondary | ICD-10-CM | POA: Diagnosis not present

## 2015-11-11 DIAGNOSIS — G20 Parkinson's disease: Secondary | ICD-10-CM

## 2015-11-11 DIAGNOSIS — R5381 Other malaise: Secondary | ICD-10-CM

## 2015-11-11 NOTE — Therapy (Signed)
[facility] [place] [address] [place], [place], [address] Phone: [phone]   Fax:  [phone]  Physical Therapy Treatment  Patient Details  Name: Tiffany York MRN: [id number] Date of Birth: [birth year] Referring Provider: Linus Mako  Encounter Date: 11/11/2015      PT End of Session - 11/11/15 1430    Visit Number 12   Date for PT Re-Evaluation 11/27/15   PT Start Time 3893   PT Stop Time 1430   PT Time Calculation (min) 42 min   Activity Tolerance Patient limited by fatigue;Patient limited by pain   Behavior During Therapy Flat affect      History reviewed. No pertinent past medical history.  History reviewed. No pertinent past surgical history.  There were no vitals filed for this visit.  Visit Diagnosis:  Difficulty walking  Debility  Parkinson's disease ([place])      Subjective Assessment - 11/11/15 1349    Subjective "Im fine"   Currently in Pain? No/denies   Multiple Pain Sites No                         [facility] Adult PT Treatment/Exercise - 11/11/15 0001    Lumbar Exercises: Aerobic   Elliptical nustep L 4 7 min   UBE (Upper Arm Bike) Level 1 x 4 minutes   Lumbar Exercises: Seated   Other Seated Lumbar Exercises OHP with red ball 2x10; sit and reach alt UE and bilat UE; bicep curls #2 2x10   Knee/Hip Exercises: Seated   Long Arc Quad 2 sets;15 reps   Long Arc Quad Weight 3 lbs.   Ball Squeeze 2x15   Marching Limitations 3# 2x10 bilaterally   Abduction/Adduction  Both;2 sets;5 reps  manual resistance                   PT Short Term Goals - 10/16/15 1451    PT SHORT TERM GOAL #1   Title independent with her sister and doing sit and get fit exercises at least 1-2 x / week   Status Achieved           PT Long Term Goals - 11/07/15 1103    PT LONG TERM GOAL #1   Title transfer with set up and CGA   PT LONG TERM GOAL #2   Title increase PROM of the left ankle to 0  degrees   Status On-going   PT LONG TERM GOAL #3   Title tolerate 5 minutes of standing   Status On-going   PT LONG TERM GOAL #4   Title walk with walker and min A 50 feet   Status On-going               Plan - 11/11/15 1431    Clinical Impression Statement No standing this date. Pt appears to have met a plateau with therapy. PT has agrees to start independent program.    PT Next Visit Plan will hold PT for 2 weeks then D/C. Pt will begin inde program         Problem List There are no active problems to display for this patient.   Scot Jun, PTA  11/11/2015, 2:32 PM  [facility] [address] [place], [place], [address] Phone: [phone]   Fax:  [phone]  Name: Tiffany York MRN: [id number] Date of Birth: Aug 30, 1931

## 2016-03-27 ENCOUNTER — Encounter (HOSPITAL_BASED_OUTPATIENT_CLINIC_OR_DEPARTMENT_OTHER): Payer: Self-pay | Admitting: *Deleted

## 2016-03-27 ENCOUNTER — Emergency Department (HOSPITAL_BASED_OUTPATIENT_CLINIC_OR_DEPARTMENT_OTHER)
Admission: EM | Admit: 2016-03-27 | Discharge: 2016-03-27 | Disposition: A | Discharge status: Home / Self Care | Payer: Medicare Other | Attending: Emergency Medicine | Admitting: Emergency Medicine

## 2016-03-27 ENCOUNTER — Emergency Department (HOSPITAL_BASED_OUTPATIENT_CLINIC_OR_DEPARTMENT_OTHER): Payer: Medicare Other

## 2016-03-27 DIAGNOSIS — G20 Parkinson's disease: Secondary | ICD-10-CM | POA: Diagnosis not present

## 2016-03-27 DIAGNOSIS — Y92009 Unspecified place in unspecified non-institutional (private) residence as the place of occurrence of the external cause: Secondary | ICD-10-CM | POA: Diagnosis not present

## 2016-03-27 DIAGNOSIS — Y9389 Activity, other specified: Secondary | ICD-10-CM | POA: Diagnosis not present

## 2016-03-27 DIAGNOSIS — S82831A Other fracture of upper and lower end of right fibula, initial encounter for closed fracture: Secondary | ICD-10-CM | POA: Diagnosis not present

## 2016-03-27 DIAGNOSIS — Z79899 Other long term (current) drug therapy: Secondary | ICD-10-CM | POA: Diagnosis not present

## 2016-03-27 DIAGNOSIS — S8251XA Displaced fracture of medial malleolus of right tibia, initial encounter for closed fracture: Secondary | ICD-10-CM | POA: Diagnosis not present

## 2016-03-27 DIAGNOSIS — Y999 Unspecified external cause status: Secondary | ICD-10-CM | POA: Insufficient documentation

## 2016-03-27 DIAGNOSIS — S82891A Other fracture of right lower leg, initial encounter for closed fracture: Secondary | ICD-10-CM

## 2016-03-27 DIAGNOSIS — W2201XA Walked into wall, initial encounter: Secondary | ICD-10-CM | POA: Insufficient documentation

## 2016-03-27 DIAGNOSIS — S99911A Unspecified injury of right ankle, initial encounter: Secondary | ICD-10-CM | POA: Diagnosis present

## 2016-03-27 HISTORY — DX: Parkinson's disease: G20

## 2016-03-27 MED ORDER — IBUPROFEN 800 MG PO TABS
800.0000 mg | ORAL_TABLET | Freq: Once | ORAL | Status: AC
  Administered 2016-03-27: 800 mg via ORAL
  Filled 2016-03-27: qty 1

## 2016-03-27 MED ORDER — ACETAMINOPHEN 500 MG PO TABS
1000.0000 mg | ORAL_TABLET | Freq: Once | ORAL | Status: AC
  Administered 2016-03-27: 1000 mg via ORAL
  Filled 2016-03-27: qty 2

## 2016-03-27 MED ORDER — TRAMADOL HCL 50 MG PO TABS: 50.0000 mg | ORAL_TABLET | Freq: Four times a day (QID) | ORAL | Status: AC | PRN

## 2016-03-27 NOTE — ED Notes (Signed)
Pt from home.  Reports that pt ran into a wall tonight in her wheelchair.  Reports right ankle and right knee pain since that time.  Bilateral ankle swelling that is chronic.

## 2016-03-27 NOTE — Discharge Instructions (Signed)
°Cast or Splint Care  ° ° °Casts and splints support injured limbs and keep bones from moving while they heal. It is important to care for your cast or splint at home.  °HOME CARE INSTRUCTIONS  °Keep the cast or splint uncovered during the drying period. It can take 24 to 48 hours to dry if it is made of plaster. A fiberglass cast will dry in less than 1 hour.  °Do not rest the cast on anything harder than a pillow for the first 24 hours.  °Do not put weight on your injured limb or apply pressure to the cast until your health care provider gives you permission.  °Keep the cast or splint dry. Wet casts or splints can lose their shape and may not support the limb as well. A wet cast that has lost its shape can also create harmful pressure on your skin when it dries. Also, wet skin can become infected.  °Cover the cast or splint with a plastic bag when bathing or when out in the rain or snow. If the cast is on the trunk of the body, take sponge baths until the cast is removed.  °If your cast does become wet, dry it with a towel or a blow dryer on the cool setting only. °Keep your cast or splint clean. Soiled casts may be wiped with a moistened cloth.  °Do not place any hard or soft foreign objects under your cast or splint, such as cotton, toilet paper, lotion, or powder.  °Do not try to scratch the skin under the cast with any object. The object could get stuck inside the cast. Also, scratching could lead to an infection. If itching is a problem, use a blow dryer on a cool setting to relieve discomfort.  °Do not trim or cut your cast or remove padding from inside of it.  °Exercise all joints next to the injury that are not immobilized by the cast or splint. For example, if you have a long leg cast, exercise the hip joint and toes. If you have an arm cast or splint, exercise the shoulder, elbow, thumb, and fingers.  °Elevate your injured arm or leg on 1 or 2 pillows for the first 1 to 3 days to decrease swelling and  pain. It is best if you can comfortably elevate your cast so it is higher than your heart. °SEEK MEDICAL CARE IF:  °Your cast or splint cracks.  °Your cast or splint is too tight or too loose.  °You have unbearable itching inside the cast.  °Your cast becomes wet or develops a soft spot or area.  °You have a bad smell coming from inside your cast.  °You get an object stuck under your cast.  °Your skin around the cast becomes red or raw.  °You have new pain or worsening pain after the cast has been applied. °SEEK IMMEDIATE MEDICAL CARE IF:  °You have fluid leaking through the cast.  °You are unable to move your fingers or toes.  °You have discolored (blue or white), cool, painful, or very swollen fingers or toes beyond the cast.  °You have tingling or numbness around the injured area.  °You have severe pain or pressure under the cast.  °You have any difficulty with your breathing or have shortness of breath.  °You have chest pain. °This information is not intended to replace advice given to you by your health care provider. Make sure you discuss any questions you have with your health care provider.  °  Document Released: 08/27/2000 Document Revised: 06/20/2013 Document Reviewed: 03/08/2013  °Elsevier Interactive Patient Education ©2016 [employer]  ° °

## 2016-03-27 NOTE — ED Provider Notes (Signed)
CSN: [id number]     Arrival date & time 03/27/16  0011 History   First MD Initiated Contact with Patient 03/27/16 0200     Chief Complaint  Patient presents with  . Ankle Injury     (Consider location/radiation/quality/duration/timing/severity/associated sxs/prior Treatment) Patient is a 82 y.o. female presenting with lower extremity injury. The history is provided by the patient.  Ankle Injury This is a new problem. The current episode started 3 to 5 hours ago. The problem occurs constantly. The problem has not changed since onset.Pertinent negatives include no chest pain, no abdominal pain, no headaches and no shortness of breath. Nothing aggravates the symptoms. Nothing relieves the symptoms. She has tried nothing for the symptoms. The treatment provided no relief.    Past Medical History  Diagnosis Date  . Parkinson disease [facility])    Past Surgical History  Procedure Laterality Date  . Abdominal hysterectomy    . Enterectomy    . Eye surgery     No family history on file. Social History  Substance Use Topics  . Smoking status: Never Smoker   . Smokeless tobacco: Not on file  . Alcohol Use: No   OB History    No data available     Review of Systems  Respiratory: Negative for shortness of breath.   Cardiovascular: Negative for chest pain.  Gastrointestinal: Negative for abdominal pain.  Neurological: Negative for headaches.  All other systems reviewed and are negative.     Allergies  Ciprofloxacin  Home Medications   Prior to Admission medications   Medication Sig Start Date End Date Taking? Authorizing Provider  carbidopa-levodopa-entacapone (STALEVO) 50-200-200 MG tablet Take 1 tablet by mouth 3 (three) times daily.   Yes Historical Provider, MD  furosemide (LASIX) 20 MG tablet Take 20 mg by mouth.   Yes Historical Provider, MD  pramipexole (MIRAPEX) 1.5 MG tablet Take 1.5 mg by mouth 3 (three) times daily.   Yes Historical Provider, MD  rasagiline (AZILECT)  0.5 MG TABS tablet Take 1 mg by mouth daily.   Yes Historical Provider, MD   BP 109/57 mmHg  Pulse 69  Temp(Src) 97.8 F (36.6 C) (Oral)  Resp 16  Ht  (1.499 m)  Wt 165 lb (74.844 kg)  BMI 33.31 kg/m2  SpO2 95% Physical Exam  Constitutional: She appears well-developed and well-nourished. No distress.  HENT:  Head: Normocephalic and atraumatic.  Mouth/Throat: Oropharynx is clear and moist.  Eyes: Conjunctivae are normal. Pupils are equal, round, and reactive to light.  Neck: Normal range of motion. Neck supple.  Cardiovascular: Normal rate, regular rhythm and intact distal pulses.   Pulmonary/Chest: Effort normal and breath sounds normal. She has no wheezes. She has no rales.  Abdominal: Soft. Bowel sounds are normal. There is no tenderness. There is no rebound and no guarding.  Musculoskeletal:       Right knee: Normal.       Right ankle: Tenderness. Lateral malleolus and medial malleolus tenderness found. Achilles tendon normal.       Right lower leg: Normal.  Neurological: She has normal reflexes.  Skin: Skin is warm and dry.  Psychiatric: She has a normal mood and affect.    ED Course  Procedures (including critical care time) Labs Review Labs Reviewed - No data to display  Imaging Review Dg Ankle Complete Right  03/27/2016  CLINICAL DATA:  Right ankle pain and swelling. Patient ran into a wall tonight in the wheelchair. EXAM: RIGHT ANKLE - COMPLETE 3+ VIEW  COMPARISON:  None. FINDINGS: Diffuse bone demineralization. Diffuse soft tissue edema. There is an acute fracture of the distal fibula and of the medial malleolus of the right ankle. No signal displacement of the fracture fragments. Talar dome appears intact although flattening due to degenerative change. No destructive bone lesions. Degenerative changes in the intertarsal joints. IMPRESSION: Acute nondisplaced fractures of the distal fibula and medial malleolus of the right ankle. Soft tissue swelling. Diffuse bone  demineralization and degenerative changes. Electronically Signed   By: Burman NievesWilliam  Stevens M.D.   On: 03/27/2016 01:18   I have personally reviewed and evaluated these images and lab results as part of my medical decision-making.   EKG Interpretation None      MDM   Final diagnoses:  None    Filed Vitals:   03/27/16 0020  BP: 109/57  Pulse: 69  Temp: 97.8 F (36.6 C)  Resp: 16     Medications  acetaminophen (TYLENOL) tablet 1,000 mg (1,000 mg Oral Given 03/27/16 0306)  ibuprofen (ADVIL,MOTRIN) tablet 800 mg (800 mg Oral Given 03/27/16 0306)    Keep boot on at all times.  Follow up this week with orthopedics.  Tylenol for mild pain.  Ultram for more severe pain. Based on history and exam patient has been appropriately medically screened and emergency conditions excluded. Patient is stable for discharge at this time.   Follow up provided and strict return precautions given.    Cy BlamerApril Yamilee Harmes, MD 03/27/16 [phone]

## 2017-08-27 IMAGING — DX DG ANKLE COMPLETE 3+V*R*
3 series · 3 of 3 positions shown · non-contrast
Comparison: None.

CLINICAL DATA: Right ankle pain and swelling. Patient ran into a
wall tonight in the wheelchair.

EXAM:
RIGHT ANKLE - COMPLETE 3+ VIEW

[ankle ap]
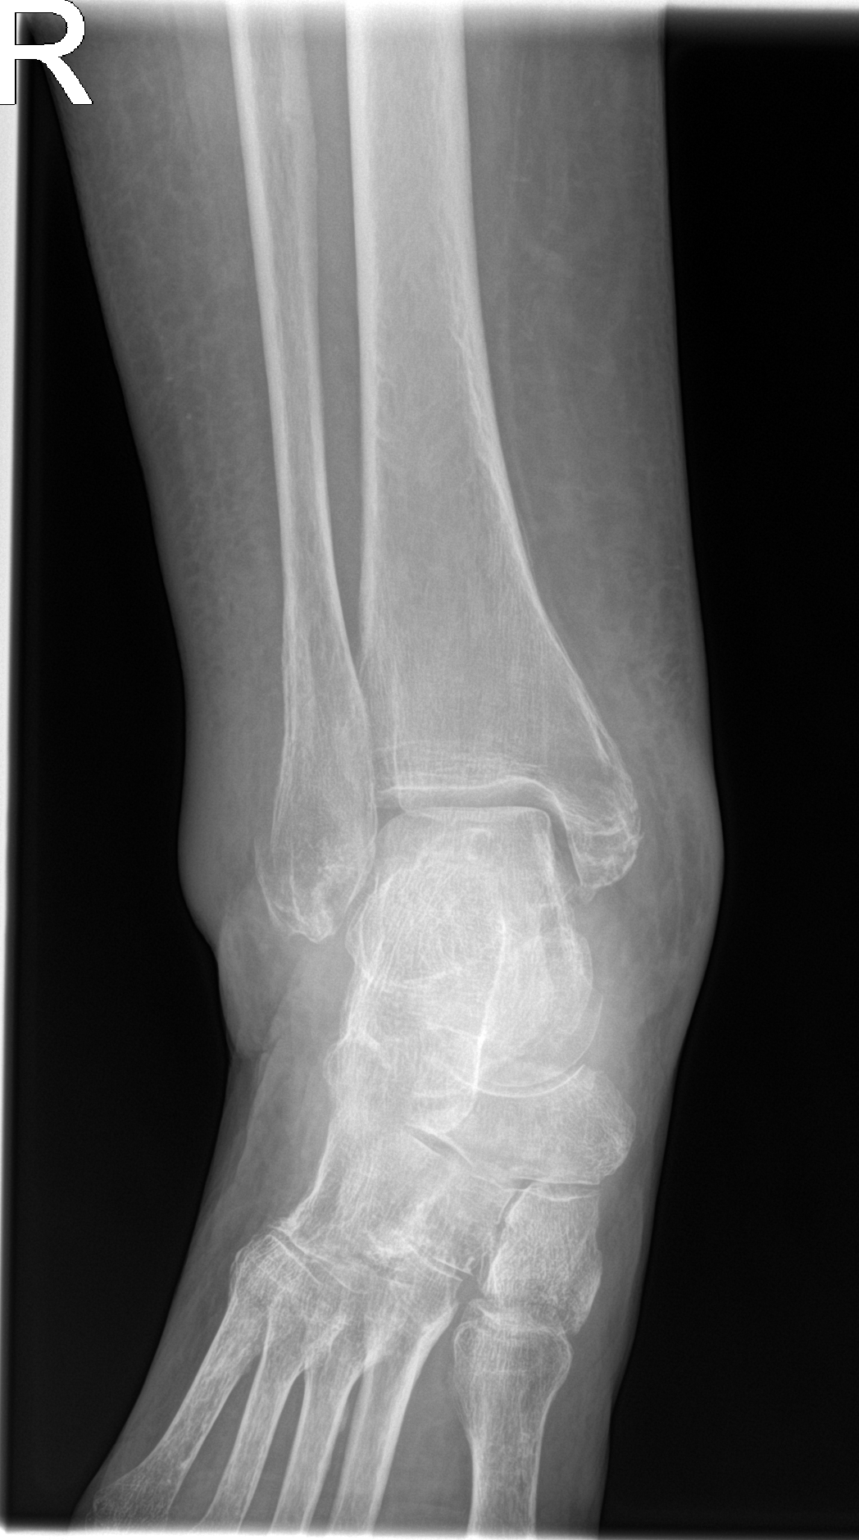

[ankle obl]
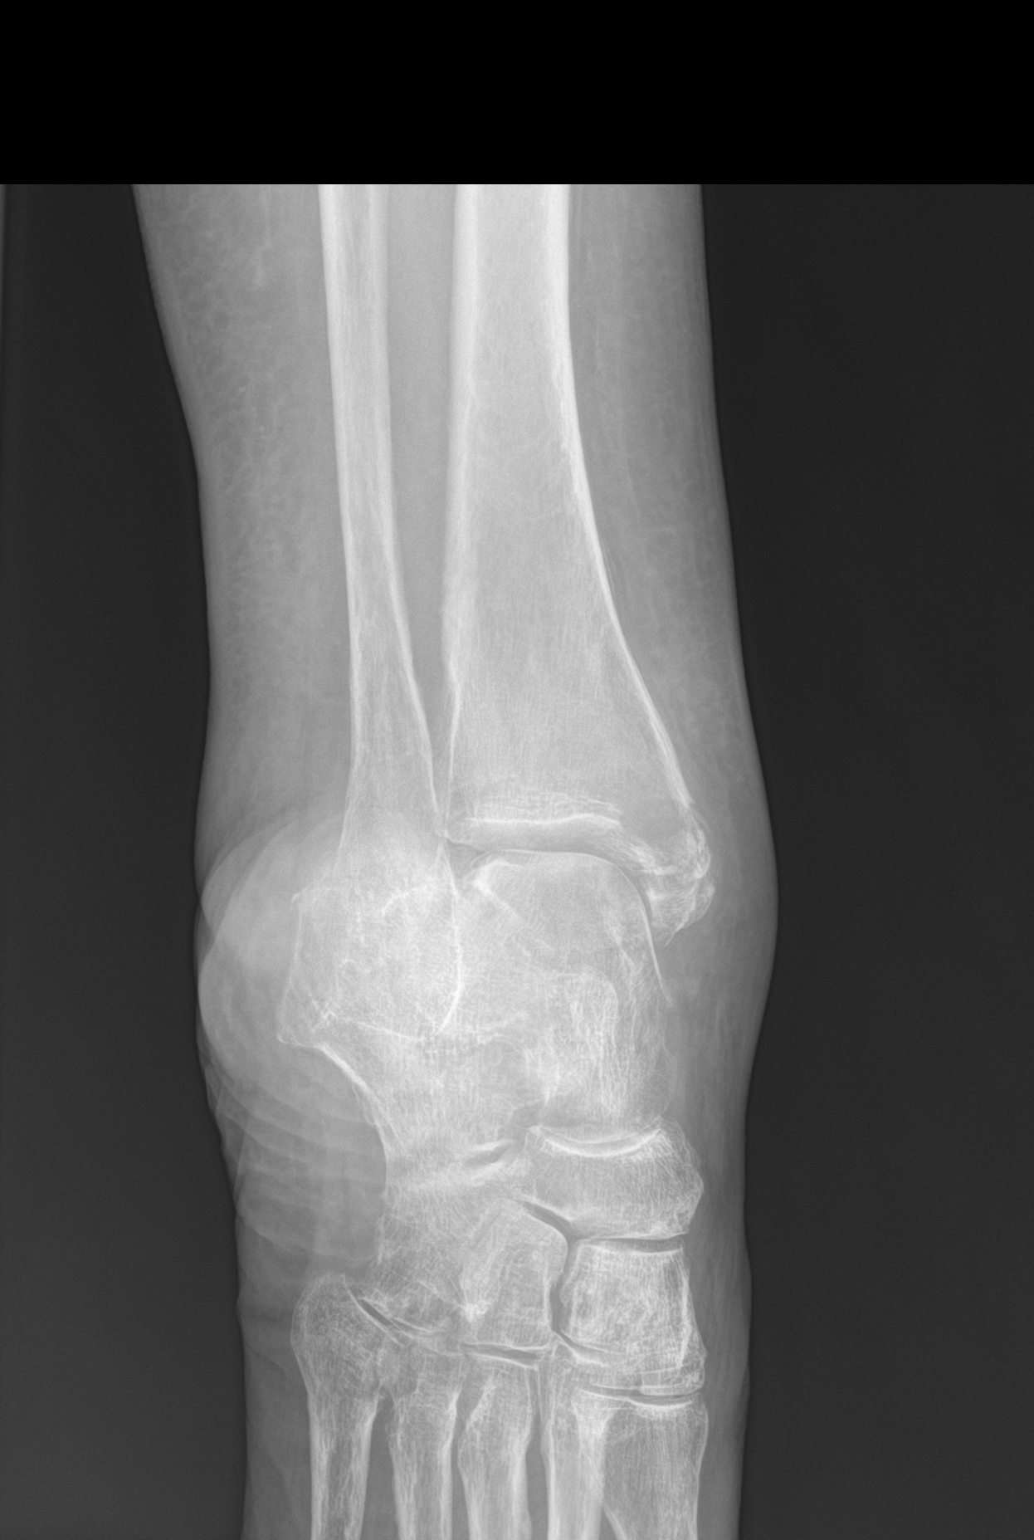

[ankle lat]
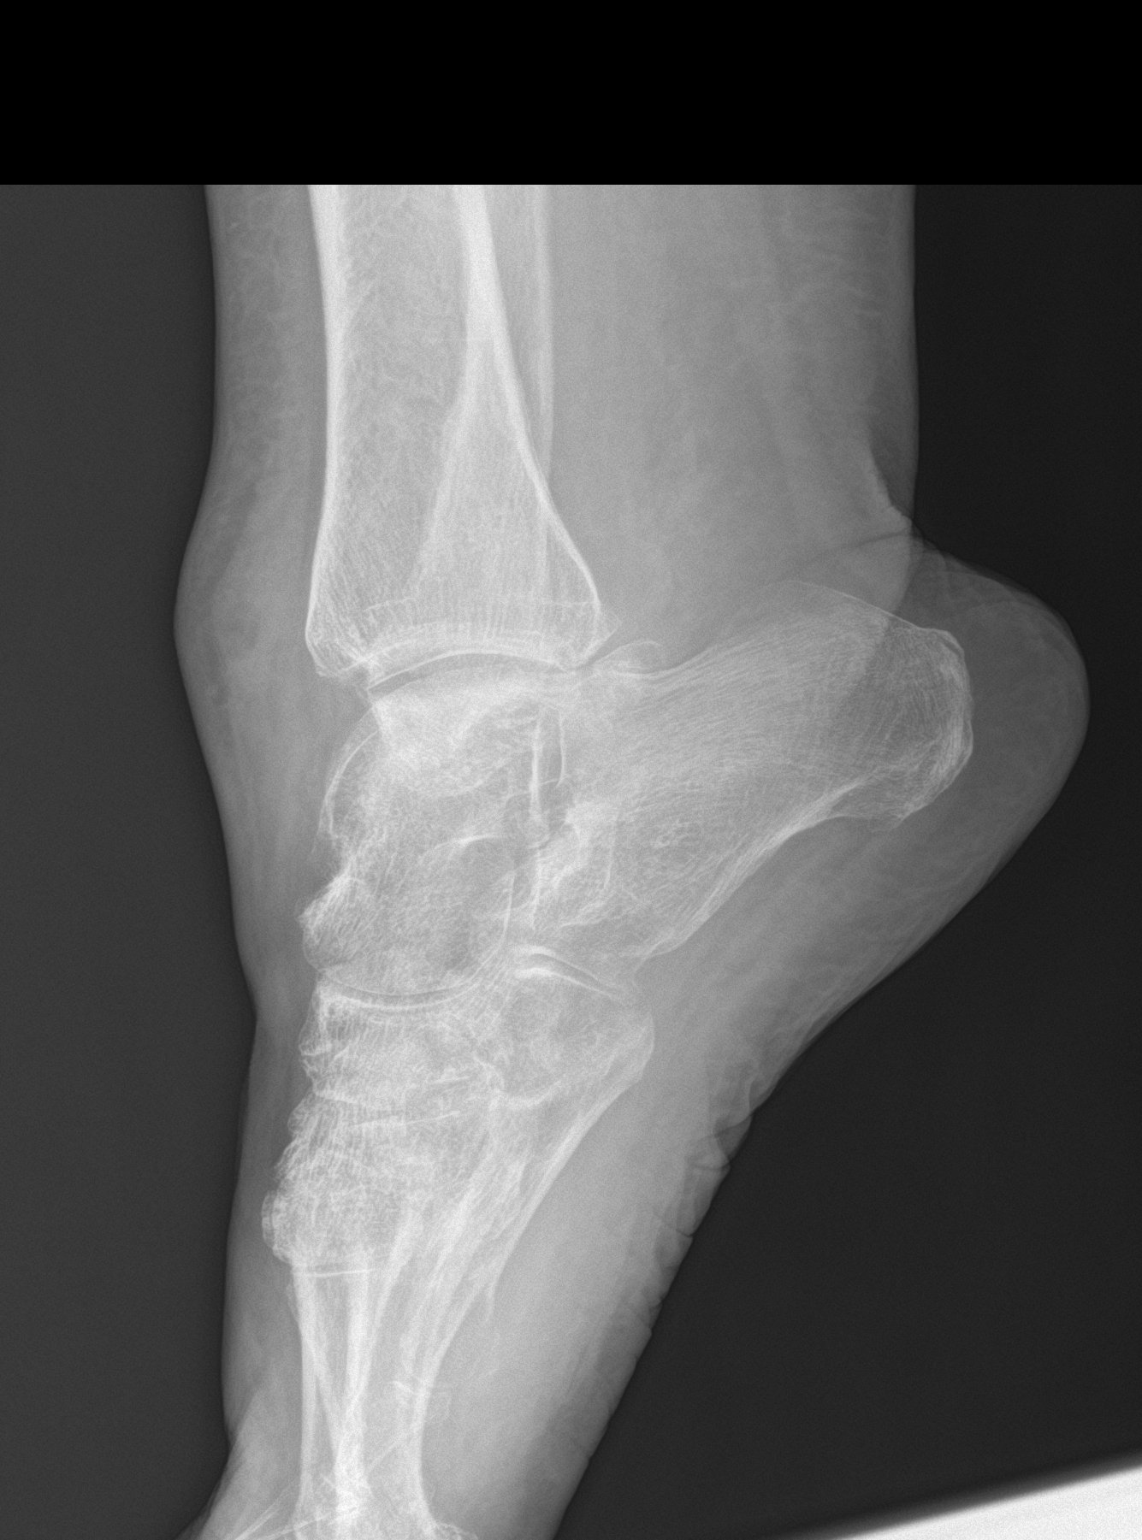

[3 of 3 positions shown; findings below may reference images not displayed]

FINDINGS: Diffuse bone demineralization. Diffuse soft tissue edema. There is
an acute fracture of the distal fibula and of the medial malleolus
of the right ankle. No signal displacement of the fracture
fragments. Talar dome appears intact although flattening due to
degenerative change. No destructive bone lesions. Degenerative
changes in the intertarsal joints.
IMPRESSION: Acute nondisplaced fractures of the distal fibula and medial
malleolus of the right ankle. Soft tissue swelling. Diffuse bone
demineralization and degenerative changes.

## 2017-08-27 IMAGING — DX DG KNEE COMPLETE 4+V*R*
4 series · 4 of 4 positions shown · non-contrast
Comparison: Right ankle radiograph dated 03/27/2016

CLINICAL DATA: 84-year-old female with trauma and right lower
extremity pain.

EXAM:
RIGHT TIBIA AND FIBULA - 2 VIEW; RIGHT KNEE - COMPLETE 4+ VIEW

[knee ap]
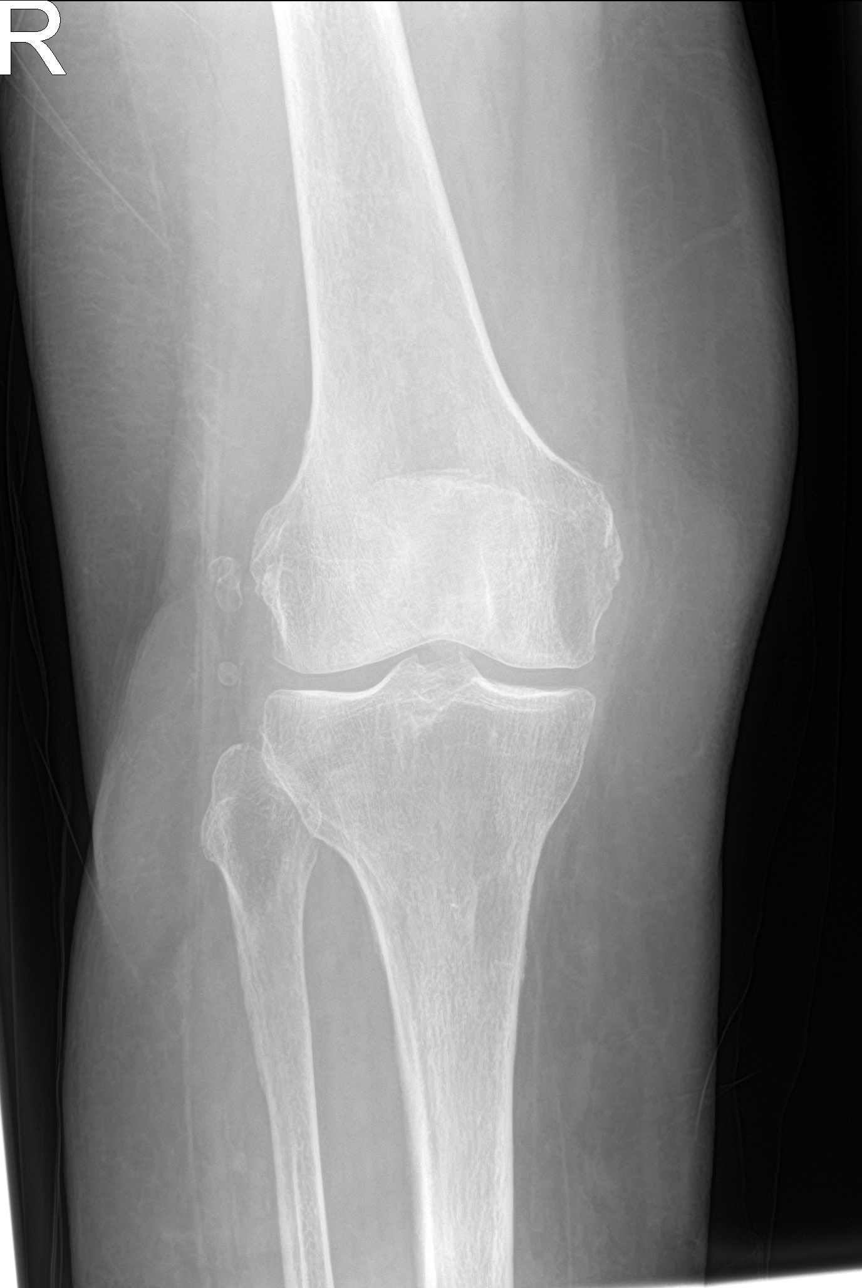

[knee lat]
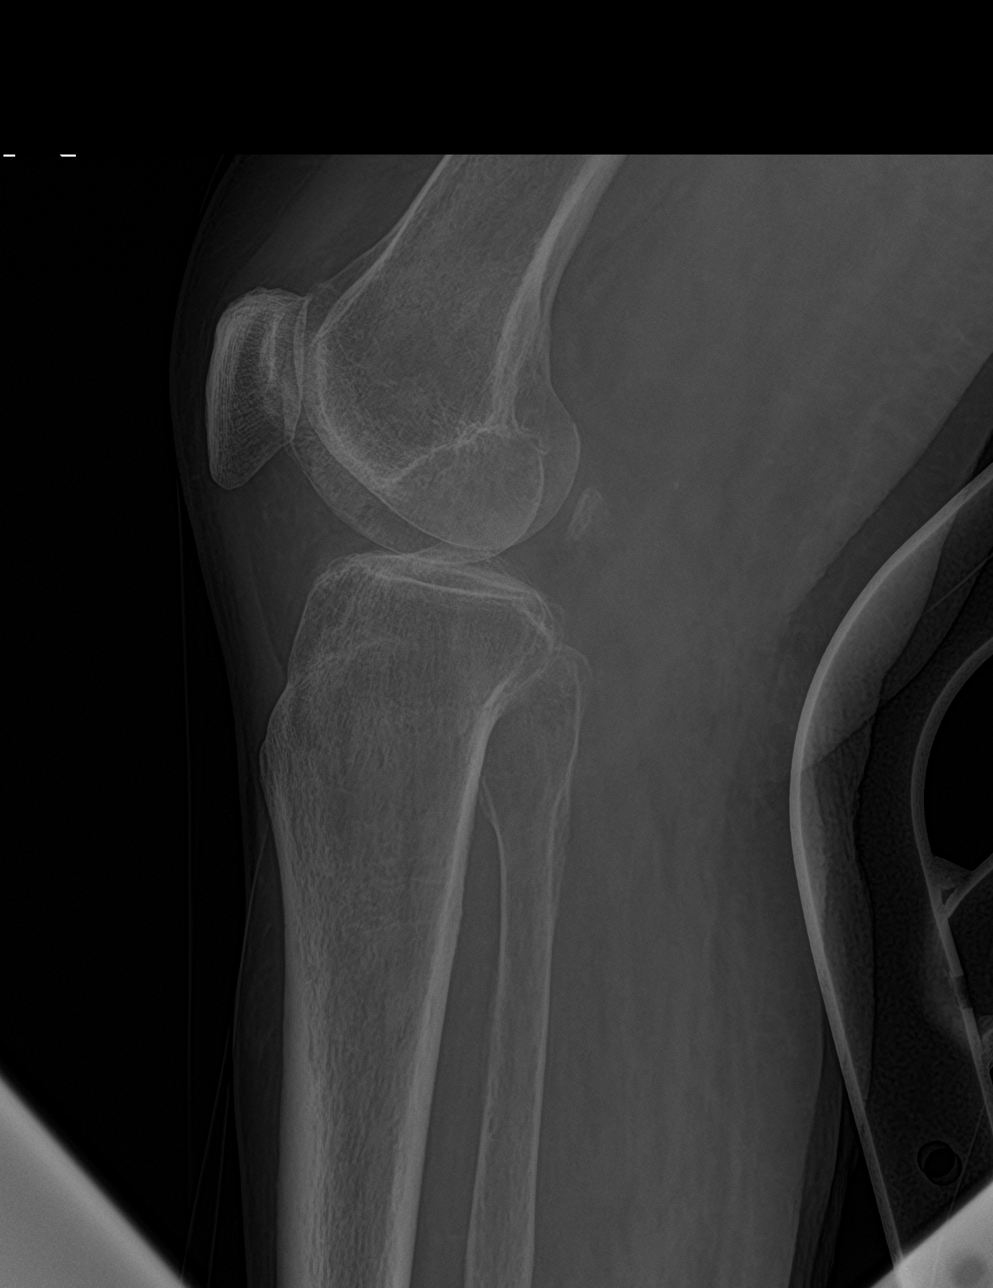

[knee obl (1 of 2)]
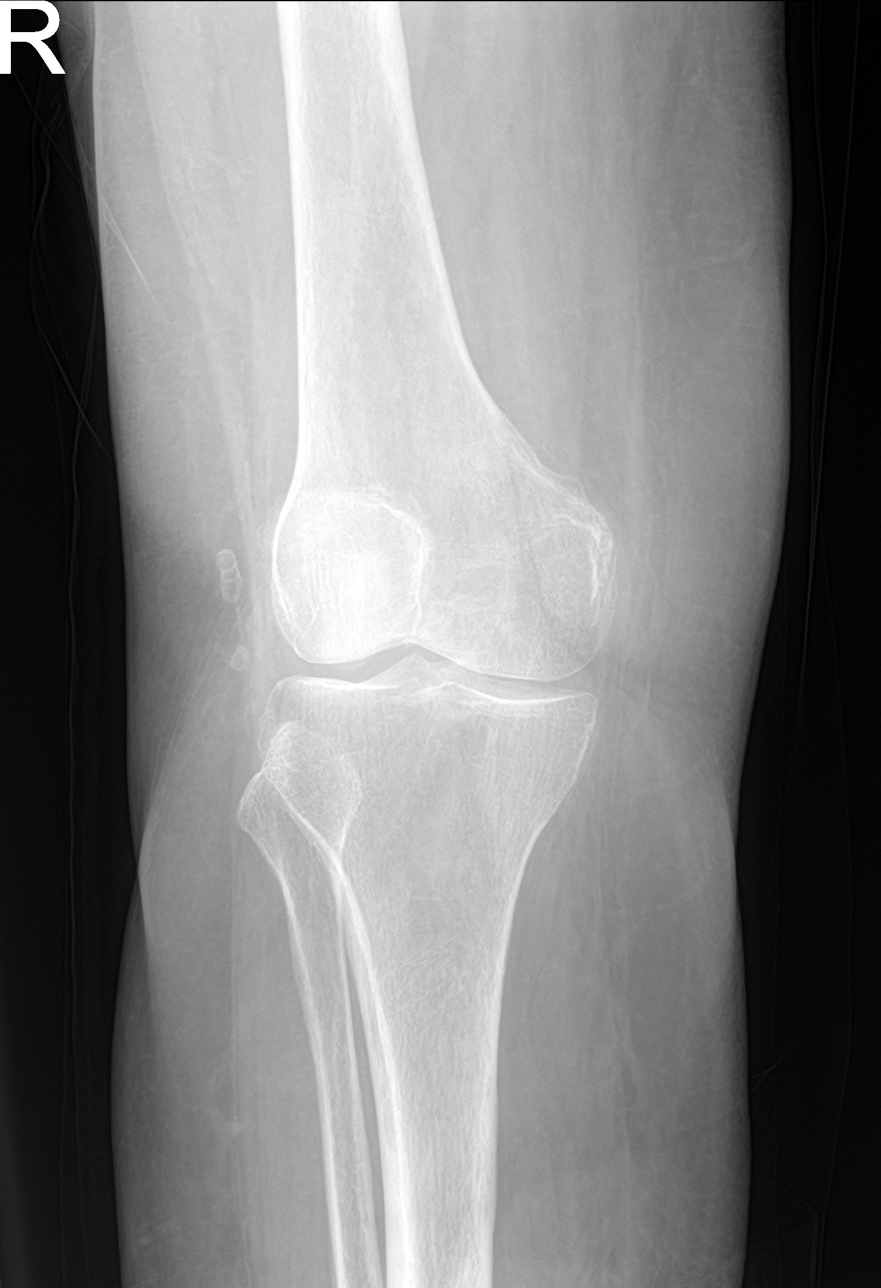

[knee obl (2 of 2)]
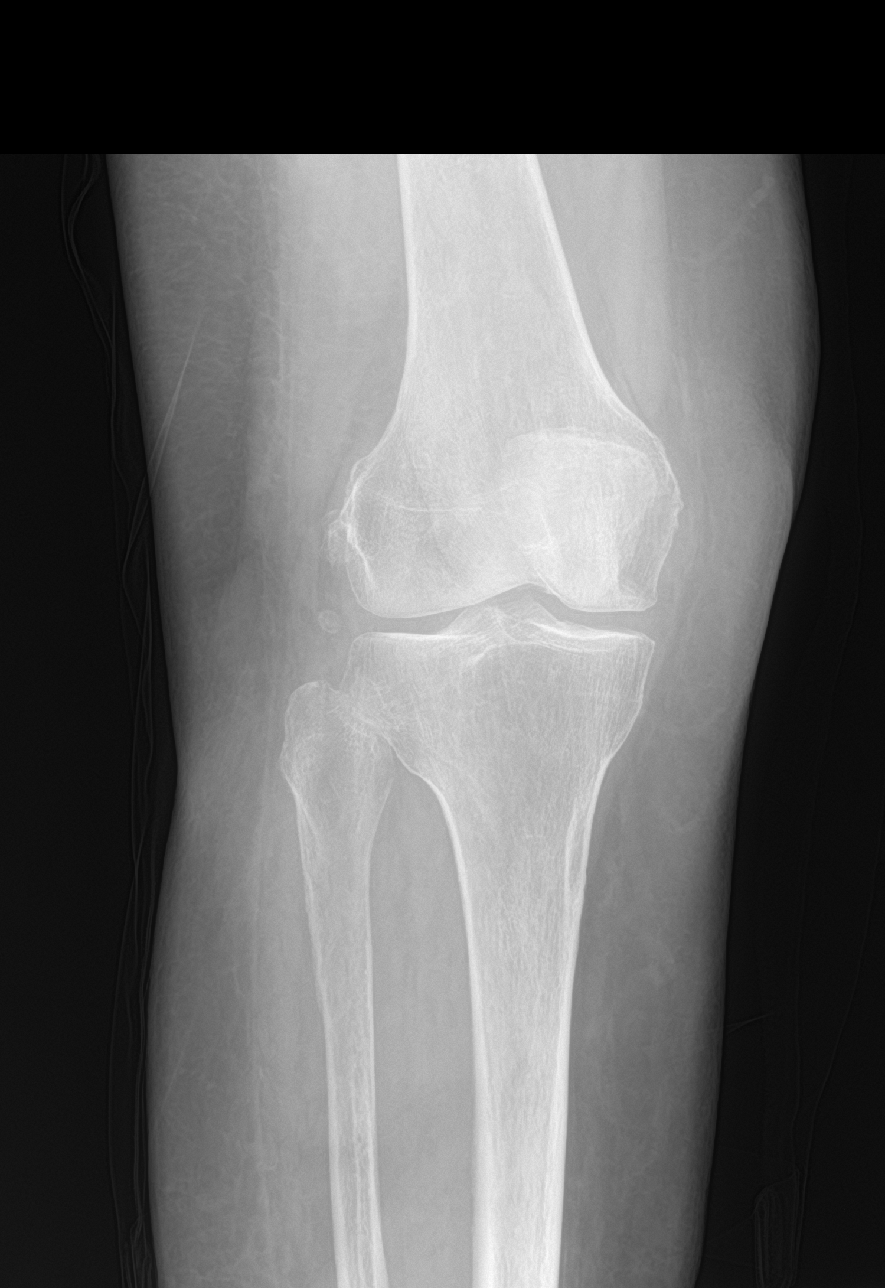

[4 of 4 positions shown; findings below may reference images not displayed]

FINDINGS: Nondisplaced fracture of the lateral malleolus again noted better
seen on the ankle radiograph. There is an old appearing fracture of
the medial malleolus with corticated margin. There is a focal area
of lucency in the superior aspect of the medial malleolus concerning
for an acute fracture. This is better seen on the ankle radiograph.
No other fracture identified. The bones are osteopenic. There is no
dislocation. There is mild narrowing of the medial and lateral
compartments of the knee compatible with mild osteoarthritic
changes. Multiple small well corticated osseous densities noted in
the soft tissues of the lateral aspect of the knee.
IMPRESSION: Fractures of the medial and lateral malleoli as seen on the ankle
radiograph. No other acute fracture identified.

## 2020-04-13 DEATH — deceased
